# Patient Record
Sex: Female | Born: 1975 | Race: White | Hispanic: No | Marital: Married | Smoking: Never smoker
Health system: Southern US, Community
[De-identification: ages and names within clinical notes are randomized; demographics above are authoritative.]

## PROBLEM LIST (undated history)

## (undated) DIAGNOSIS — G43909 Migraine, unspecified, not intractable, without status migrainosus: Secondary | ICD-10-CM

## (undated) HISTORY — PX: GANGLION CYST EXCISION: SHX1691

## (undated) HISTORY — PX: TUBAL LIGATION: SHX77

## (undated) HISTORY — PX: OTHER SURGICAL HISTORY: SHX169

---

## 2017-01-14 ENCOUNTER — Ambulatory Visit (INDEPENDENT_AMBULATORY_CARE_PROVIDER_SITE_OTHER): Payer: BLUE CROSS/BLUE SHIELD | Admitting: Gastroenterology

## 2017-01-14 ENCOUNTER — Encounter: Payer: Self-pay | Admitting: Gastroenterology

## 2017-01-14 ENCOUNTER — Other Ambulatory Visit
Admission: RE | Admit: 2017-01-14 | Discharge: 2017-01-14 | Disposition: A | Discharge status: Home / Self Care | Payer: BLUE CROSS/BLUE SHIELD | Source: Ambulatory Visit | Attending: Gastroenterology | Admitting: Gastroenterology

## 2017-01-14 ENCOUNTER — Other Ambulatory Visit: Payer: Self-pay

## 2017-01-14 VITALS — BP 133/80 | HR 88 | Temp 99.8°F | Ht 63.0 in | Wt 253.2 lb

## 2017-01-14 DIAGNOSIS — R1011 Right upper quadrant pain: Secondary | ICD-10-CM | POA: Diagnosis present

## 2017-01-14 DIAGNOSIS — Z8719 Personal history of other diseases of the digestive system: Secondary | ICD-10-CM | POA: Insufficient documentation

## 2017-01-14 DIAGNOSIS — K625 Hemorrhage of anus and rectum: Secondary | ICD-10-CM | POA: Insufficient documentation

## 2017-01-14 DIAGNOSIS — R109 Unspecified abdominal pain: Secondary | ICD-10-CM | POA: Insufficient documentation

## 2017-01-14 LAB — BASIC METABOLIC PANEL
Anion gap: 8 (ref 5–15)
BUN: 11 mg/dL (ref 6–20)
CALCIUM: 9.1 mg/dL (ref 8.9–10.3)
CO2: 22 mmol/L (ref 22–32)
CREATININE: 0.72 mg/dL (ref 0.44–1.00)
Chloride: 108 mmol/L (ref 101–111)
GFR calc Af Amer: >60 mL/min (ref >60)
GFR calc non Af Amer: >60 mL/min (ref >60)
GLUCOSE: 105 mg/dL — AB (ref 65–99)
Potassium: 3.7 mmol/L (ref 3.5–5.1)
Sodium: 138 mmol/L (ref 135–145)

## 2017-01-14 LAB — CBC
HEMATOCRIT: 42.7 % (ref 35.0–47.0)
HEMOGLOBIN: 13.9 g/dL (ref 12.0–16.0)
MCH: 26 pg (ref 26.0–34.0)
MCHC: 32.6 g/dL (ref 32.0–36.0)
MCV: 79.6 fL — AB (ref 80.0–100.0)
Platelets: 252 10*3/uL (ref 150–440)
RBC: 5.37 MIL/uL — ABNORMAL HIGH (ref 3.80–5.20)
RDW: 15.2 % — AB (ref 11.5–14.5)
WBC: 7.5 10*3/uL (ref 3.6–11.0)

## 2017-01-14 LAB — PROTIME-INR
INR: 1.15
Prothrombin Time: 14.8 seconds (ref 11.4–15.2)

## 2017-01-14 LAB — HEPATIC FUNCTION PANEL
ALK PHOS: 63 U/L (ref 38–126)
ALT: 15 U/L (ref 14–54)
AST: 18 U/L (ref 15–41)
Albumin: 4 g/dL (ref 3.5–5.0)
BILIRUBIN TOTAL: 0.6 mg/dL (ref 0.3–1.2)
Total Protein: 7.4 g/dL (ref 6.5–8.1)

## 2017-01-14 NOTE — Progress Notes (Signed)
Stefanie Darby, MD [address]  [place]  [place], [place] [address]  Main: [phone]  Fax: [phone]    Gastroenterology Consultation  Referring Provider:     Self-referral Primary Care Physician:  NO PCP Primary Gastroenterologist:  Dr. Cephas Carpenter Reason for Consultation:     Rectal bleeding, abdominal pain        HPI:   Stefanie Carpenter is a 41 y.o. y/o female referred by Dr. Patient, No Pcp Per  for consultation & management  of rectal bleeding, abdominal pain.   Abdominal pain: She reports having what she thinks might be a "gallbladder attack". On 09/18/2016 she had right upper quadrant pain and went to the ER in [place], had an ultrasound and there were no evidence of gallstones. She had a second attack on 09/28/2016 and at that time she had HIDA scan which showed gallbladder ejection fraction of 59%. She seemed to be very worried about this pain because most of her family members had gallbladder removed and she is worried that she might have something held due to the problem with regards to her gallbladder. In addition to intermittent right upper quadrant pain she also reports abdominal pain in different locations every time. And her pain is not really associated with any other GI symptoms. She has gained weight significantly after her second child and she went on a Keto diet in the beginning of this year. Lost about 25 pounds, she felt great and cleared from depression. Later fell through the cracks, developed a lot of craving for sugars, started gaining more weight. She denies having any stress in her life.   Rectal bleeding: She reports first episode of rectal bleeding 10/06/2016 and predominantly on wiping. Second episode on 11/13/2016 associated with stool and noticed some drops of blood. Third episode on 12/10/2016 within the stool and she was constipated at the time she felt that she noticed blood for almost 12 hours and felt fatigued for a day or 2. She  reports having one to 2 bowel movements daily, denies any constipation. She reports history of menorrhagia for which she underwent uterine ablation. She is wondering if her rectal bleeding and her abdominal pain might be related to this procedure.  She is not in any prescription medications nor over-the-counter supplements. Denies smoking, drinks alcohol occasionally. She stays at home taking care of her kids.  GI Procedures: none  No past medical history on file.  No past surgical history on file.  Prior to Admission medications   Not on File    No family history on file.   Social History  Substance Use Topics  . Smoking status: Not on file  . Smokeless tobacco: Not on file  . Alcohol use Not on file    Allergies as of 01/14/2017  . (Not on File)    Review of Systems:    All systems reviewed and negative except where noted in HPI.   Physical Exam:  There were no vitals taken for this visit. No LMP recorded.  General:   Alert,  Well-developed, well-nourished, pleasant and cooperative in NAD Head:  Normocephalic and atraumatic. Eyes:  Sclera clear, no icterus.   Conjunctiva pink. Ears:  Normal auditory acuity. Nose:  No deformity, discharge, or lesions. Mouth:  No deformity or lesions,oropharynx pink & moist. Neck:  Supple; no masses or thyromegaly. Lungs:  Respirations even and unlabored.  Clear throughout to auscultation.   No wheezes, crackles, or rhonchi. No acute distress. Heart:  Regular  rate and rhythm; no murmurs, clicks, rubs, or gallops. Abdomen:  Normal bowel sounds.  No bruits.  Soft, non-tender and non-distended without masses, hepatosplenomegaly or hernias noted.  No guarding or rebound tenderness.   Rectal: Nor performed Msk:  Symmetrical without gross deformities. Good, equal movement & strength bilaterally. Pulses:  Normal pulses noted. Extremities:  No clubbing or edema.  No cyanosis. Neurologic:  Alert and oriented x3;  grossly normal  neurologically. Skin:  Intact without significant lesions or rashes. No jaundice. Lymph Nodes:  No significant cervical adenopathy. Psych:  Alert and cooperative. Normal mood and affect.  Imaging Studies: None performed  Assessment and Plan:   Stefanie Carpenter is a 41 y.o. y/o female with Morbid obesity, intermittent episodes of rectal bleeding since May 2018, generalized abdominal pain, intermittent since April 2018. Her abdominal pain appears very functional associated with her eating habits. However, I recommend Right upper quadrant ultrasound, EGD with biopsies to rule out H. Pylori and peptic ulcer disease. I also recommend colonoscopy for these episodes of rectal bleeding. Perform basic labs today. In the meantime, recommend trial of IB-guard  Follow up in 3 months   Stefanie Darby, MD

## 2017-01-14 NOTE — Patient Instructions (Signed)
1. Blood work today 2. EGD and colonoscopy 3. RUQ U/S 4.Trail of IB-gaurd  Please call our office to speak with my nurse Driscilla Grammes at [phone] during business hours from 8am to 4pm if you have any questions/concerns. During after hours, you will be redirected to on call GI physician. For any emergency please call 911 or go the nearest emergency room.    Cephas Darby, MD [address]  [place]  [place], [place] [address]  Main: [phone]  Fax: [phone]

## 2017-01-15 ENCOUNTER — Telehealth: Payer: Self-pay

## 2017-01-15 ENCOUNTER — Other Ambulatory Visit: Payer: Self-pay

## 2017-01-15 DIAGNOSIS — K625 Hemorrhage of anus and rectum: Secondary | ICD-10-CM

## 2017-01-15 DIAGNOSIS — R1011 Right upper quadrant pain: Secondary | ICD-10-CM

## 2017-01-15 NOTE — Telephone Encounter (Signed)
Pt has requested date and location change for Colonoscopy a nd EGD.  Date is now 09/04/18Tuesday at [facility].

## 2017-01-18 ENCOUNTER — Ambulatory Visit
Admission: RE | Admit: 2017-01-18 | Discharge: 2017-01-18 | Disposition: A | Discharge status: Home / Self Care | Payer: BLUE CROSS/BLUE SHIELD | Source: Ambulatory Visit | Attending: Gastroenterology | Admitting: Gastroenterology

## 2017-01-18 DIAGNOSIS — R932 Abnormal findings on diagnostic imaging of liver and biliary tract: Secondary | ICD-10-CM | POA: Diagnosis not present

## 2017-01-18 DIAGNOSIS — R1011 Right upper quadrant pain: Secondary | ICD-10-CM | POA: Diagnosis not present

## 2017-01-21 ENCOUNTER — Encounter: Payer: Self-pay | Admitting: *Deleted

## 2017-01-27 NOTE — Discharge Instructions (Signed)
General Anesthesia, Adult, Care After °These instructions provide you with information about caring for yourself after your procedure. Your health care provider may also give you more specific instructions. Your treatment has been planned according to current medical practices, but problems sometimes occur. Call your health care provider if you have any problems or questions after your procedure. °What can I expect after the procedure? °After the procedure, it is common to have: °· Vomiting. °· A sore throat. °· Mental slowness. ° °It is common to feel: °· Nauseous. °· Cold or shivery. °· Sleepy. °· Tired. °· Sore or achy, even in parts of your body where you did not have surgery. ° °Follow these instructions at home: °For at least 24 hours after the procedure: °· Do not: °? Participate in activities where you could fall or become injured. °? Drive. °? Use heavy machinery. °? Drink alcohol. °? Take sleeping pills or medicines that cause drowsiness. °? Make important decisions or sign legal documents. °? Take care of children on your own. °· Rest. °Eating and drinking °· If you vomit, drink water, juice, or soup when you can drink without vomiting. °· Drink enough fluid to keep your urine clear or pale yellow. °· Make sure you have little or no nausea before eating solid foods. °· Follow the diet recommended by your health care provider. °General instructions °· Have a responsible adult stay with you until you are awake and alert. °· Return to your normal activities as told by your health care provider. Ask your health care provider what activities are safe for you. °· Take over-the-counter and prescription medicines only as told by your health care provider. °· If you smoke, do not smoke without supervision. °· Keep all follow-up visits as told by your health care provider. This is important. °Contact a health care provider if: °· You continue to have nausea or vomiting at home, and medicines are not helpful. °· You  cannot drink fluids or start eating again. °· You cannot urinate after 8-12 hours. °· You develop a skin rash. °· You have fever. °· You have increasing redness at the site of your procedure. °Get help right away if: °· You have difficulty breathing. °· You have chest pain. °· You have unexpected bleeding. °· You feel that you are having a life-threatening or urgent problem. °This information is not intended to replace advice given to you by your health care provider. Make sure you discuss any questions you have with your health care provider. °Document Released: 08/17/2000 Document Revised: 10/14/2015 Document Reviewed: 04/25/2015 °Elsevier Interactive Patient Education © 2018 [employer] Inc. ° °

## 2017-01-28 ENCOUNTER — Ambulatory Visit: Payer: BLUE CROSS/BLUE SHIELD | Admitting: Anesthesiology

## 2017-01-28 ENCOUNTER — Encounter
Admission: RE | Disposition: A | Discharge status: Home / Self Care | Payer: Self-pay | Source: Ambulatory Visit | Attending: Gastroenterology

## 2017-01-28 ENCOUNTER — Ambulatory Visit
Admission: RE | Admit: 2017-01-28 | Discharge: 2017-01-28 | Disposition: A | Discharge status: Home / Self Care | Payer: BLUE CROSS/BLUE SHIELD | Source: Ambulatory Visit | Attending: Gastroenterology | Admitting: Gastroenterology

## 2017-01-28 DIAGNOSIS — R1011 Right upper quadrant pain: Secondary | ICD-10-CM | POA: Diagnosis not present

## 2017-01-28 DIAGNOSIS — K635 Polyp of colon: Secondary | ICD-10-CM

## 2017-01-28 DIAGNOSIS — K625 Hemorrhage of anus and rectum: Secondary | ICD-10-CM

## 2017-01-28 DIAGNOSIS — D125 Benign neoplasm of sigmoid colon: Secondary | ICD-10-CM | POA: Diagnosis not present

## 2017-01-28 DIAGNOSIS — K640 First degree hemorrhoids: Secondary | ICD-10-CM | POA: Diagnosis not present

## 2017-01-28 DIAGNOSIS — D123 Benign neoplasm of transverse colon: Secondary | ICD-10-CM | POA: Diagnosis not present

## 2017-01-28 DIAGNOSIS — K921 Melena: Secondary | ICD-10-CM | POA: Insufficient documentation

## 2017-01-28 DIAGNOSIS — R1013 Epigastric pain: Secondary | ICD-10-CM | POA: Diagnosis not present

## 2017-01-28 HISTORY — PX: COLONOSCOPY WITH PROPOFOL: SHX5780

## 2017-01-28 HISTORY — PX: POLYPECTOMY: SHX5525

## 2017-01-28 HISTORY — PX: ESOPHAGOGASTRODUODENOSCOPY (EGD) WITH PROPOFOL: SHX5813

## 2017-01-28 HISTORY — DX: Migraine, unspecified, not intractable, without status migrainosus: G43.909

## 2017-01-28 SURGERY — COLONOSCOPY WITH PROPOFOL
Anesthesia: General | Site: Rectum | Wound class: Dirty or Infected

## 2017-01-28 MED ORDER — LACTATED RINGERS IV SOLN
10.0000 mL/h | INTRAVENOUS | Status: DC
  Administered 2017-01-28: 10:00:00 via INTRAVENOUS
  Administered 2017-01-28: 10 mL/h via INTRAVENOUS

## 2017-01-28 MED ORDER — STERILE WATER FOR IRRIGATION IR SOLN
Status: DC | PRN
  Administered 2017-01-28: 10:00:00

## 2017-01-28 MED ORDER — SODIUM CHLORIDE 0.9 % IV SOLN: INTRAVENOUS | Status: DC

## 2017-01-28 MED ORDER — PROPOFOL 10 MG/ML IV BOLUS
INTRAVENOUS | Status: DC | PRN
  Administered 2017-01-28 (×3): 20 mg via INTRAVENOUS
  Administered 2017-01-28: 80 mg via INTRAVENOUS
  Administered 2017-01-28: 20 mg via INTRAVENOUS
  Administered 2017-01-28: 30 mg via INTRAVENOUS
  Administered 2017-01-28: 20 mg via INTRAVENOUS
  Administered 2017-01-28: 10 mg via INTRAVENOUS

## 2017-01-28 MED ORDER — GLYCOPYRROLATE 0.2 MG/ML IJ SOLN
INTRAMUSCULAR | Status: DC | PRN
  Administered 2017-01-28: 0.2 mg via INTRAVENOUS

## 2017-01-28 MED ORDER — PROMETHAZINE HCL 25 MG/ML IJ SOLN: 6.2500 mg | INTRAMUSCULAR | Status: DC | PRN

## 2017-01-28 MED ORDER — MEPERIDINE HCL 25 MG/ML IJ SOLN: 6.2500 mg | INTRAMUSCULAR | Status: DC | PRN

## 2017-01-28 MED ORDER — LIDOCAINE HCL (CARDIAC) 20 MG/ML IV SOLN
INTRAVENOUS | Status: DC | PRN
  Administered 2017-01-28: 50 mg via INTRAVENOUS

## 2017-01-28 MED ORDER — FENTANYL CITRATE (PF) 100 MCG/2ML IJ SOLN: 25.0000 ug | INTRAMUSCULAR | Status: DC | PRN

## 2017-01-28 MED ORDER — OXYCODONE HCL 5 MG PO TABS
5.0000 mg | ORAL_TABLET | Freq: Once | ORAL | Status: DC | PRN
Start: 2017-01-28 — End: 2017-01-28

## 2017-01-28 MED ORDER — OXYCODONE HCL 5 MG/5ML PO SOLN: 5.0000 mg | Freq: Once | ORAL | Status: DC | PRN

## 2017-01-28 SURGICAL SUPPLY — 35 items
BALLN DILATOR 10-12 8 (BALLOONS)
BALLN DILATOR 12-15 8 (BALLOONS)
BALLN DILATOR 15-18 8 (BALLOONS)
BALLN DILATOR CRE 0-12 8 (BALLOONS)
BALLN DILATOR ESOPH 8 10 CRE (MISCELLANEOUS) IMPLANT
BALLOON DILATOR 12-15 8 (BALLOONS) IMPLANT
BALLOON DILATOR 15-18 8 (BALLOONS) IMPLANT
BALLOON DILATOR CRE 0-12 8 (BALLOONS) IMPLANT
BLOCK BITE 60FR ADLT L/F GRN (MISCELLANEOUS) ×4 IMPLANT
CANISTER SUCT 1200ML W/VALVE (MISCELLANEOUS) ×4 IMPLANT
CLIP HMST 235XBRD CATH ROT (MISCELLANEOUS) IMPLANT
CLIP RESOLUTION 360 11X235 (MISCELLANEOUS)
FCP ESCP3.2XJMB 240X2.8X (MISCELLANEOUS)
FORCEPS BIOP RAD 4 LRG CAP 4 (CUTTING FORCEPS) ×4 IMPLANT
FORCEPS BIOP RJ4 240 W/NDL (MISCELLANEOUS)
FORCEPS ESCP3.2XJMB 240X2.8X (MISCELLANEOUS) IMPLANT
GOWN CVR UNV OPN BCK APRN NK (MISCELLANEOUS) ×4 IMPLANT
GOWN ISOL THUMB LOOP REG UNIV (MISCELLANEOUS) ×4
INJECTOR VARIJECT VIN23 (MISCELLANEOUS) IMPLANT
KIT DEFENDO VALVE AND CONN (KITS) IMPLANT
KIT ENDO PROCEDURE OLY (KITS) ×4 IMPLANT
MARKER SPOT ENDO TATTOO 5ML (MISCELLANEOUS) IMPLANT
PAD GROUND ADULT SPLIT (MISCELLANEOUS) IMPLANT
PROBE APC STR FIRE (PROBE) IMPLANT
RETRIEVER NET PLAT FOOD (MISCELLANEOUS) IMPLANT
RETRIEVER NET ROTH 2.5X230 LF (MISCELLANEOUS) IMPLANT
SNARE SHORT THROW 13M SML OVAL (MISCELLANEOUS) IMPLANT
SNARE SHORT THROW 30M LRG OVAL (MISCELLANEOUS) IMPLANT
SNARE SNG USE RND 15MM (INSTRUMENTS) IMPLANT
SPOT EX ENDOSCOPIC TATTOO (MISCELLANEOUS)
SYR INFLATION 60ML (SYRINGE) IMPLANT
TRAP ETRAP POLY (MISCELLANEOUS) IMPLANT
VARIJECT INJECTOR VIN23 (MISCELLANEOUS)
WATER STERILE IRR 250ML POUR (IV SOLUTION) ×4 IMPLANT
WIRE CRE 18-20MM 8CM F G (MISCELLANEOUS) IMPLANT

## 2017-01-28 NOTE — H&P (Signed)
   Lucilla Lame, MD [address] [place], [place] [address] [phone] Fax : [phone]  Primary Care Physician:  Patient, No Pcp Per Primary Gastroenterologist:  Dr. Allen Norris  Pre-Procedure History & Physical: HPI:  Stefanie Carpenter is a 41 y.o. female is here for an endoscopy and colonoscopy.   Past Medical History:  Diagnosis Date  . Migraine headache    1-2x/mo    Past Surgical History:  Procedure Laterality Date  . GANGLION CYST EXCISION    . nova sure    . TUBAL LIGATION      Prior to Admission medications   Not on File    Allergies as of 01/15/2017  . (No Known Allergies)    History reviewed. No pertinent family history.  Social History   Social History  . Marital status: Married    Spouse name: N/A  . Number of children: N/A  . Years of education: N/A   Occupational History  . Not on file.   Social History Main Topics  . Smoking status: Never Smoker  . Smokeless tobacco: Never Used  . Alcohol use Yes     Comment: occasional wine  . Drug use: No  . Sexual activity: Yes   Other Topics Concern  . Not on file   Social History Narrative  . No narrative on file    Review of Systems: See HPI, otherwise negative ROS  Physical Exam: BP 107/79   Pulse 75   Temp 97.9 F (36.6 C)   Resp 16   Ht 5\' 3"  (1.6 m)   Wt 246 lb (111.6 kg)   LMP 10/06/2016 (Approximate) Comment: Nova sure 03/15. still spots occasionally preg test neg  SpO2 100%   BMI 43.58 kg/m  General:   Alert,  pleasant and cooperative in NAD Head:  Normocephalic and atraumatic. Neck:  Supple; no masses or thyromegaly. Lungs:  Clear throughout to auscultation.    Heart:  Regular rate and rhythm. Abdomen:  Soft, nontender and nondistended. Normal bowel sounds, without guarding, and without rebound.   Neurologic:  Alert and  oriented x4;  grossly normal neurologically.  Impression/Plan: Stefanie Carpenter is here for an endoscopy and colonoscopy to be performed  for hematochezia and abd pain  Risks, benefits, limitations, and alternatives regarding  endoscopy and colonoscopy have been reviewed with the patient.  Questions have been answered.  All parties agreeable.   Lucilla Lame, MD  01/28/2017, 9:34 AM

## 2017-01-28 NOTE — Anesthesia Postprocedure Evaluation (Signed)
Anesthesia Post Note  Patient: Stefanie Carpenter  Procedure(s) Performed: Procedure(s) (LRB): COLONOSCOPY WITH PROPOFOL (N/A) ESOPHAGOGASTRODUODENOSCOPY (EGD) WITH PROPOFOL (N/A) POLYPECTOMY (N/A)  Patient location during evaluation: PACU Anesthesia Type: General Level of consciousness: awake and alert Pain management: pain level controlled Vital Signs Assessment: post-procedure vital signs reviewed and stable Respiratory status: spontaneous breathing, nonlabored ventilation, respiratory function stable and patient connected to nasal cannula oxygen Cardiovascular status: blood pressure returned to baseline and stable Postop Assessment: no signs of nausea or vomiting Anesthetic complications: no    SCOURAS, NICOLE ELAINE

## 2017-01-28 NOTE — Transfer of Care (Signed)
Immediate Anesthesia Transfer of Care Note  Patient: Stefanie Carpenter  Procedure(s) Performed: Procedure(s) with comments: COLONOSCOPY WITH PROPOFOL (N/A) ESOPHAGOGASTRODUODENOSCOPY (EGD) WITH PROPOFOL (N/A) - requests later in AM POLYPECTOMY (N/A)  Patient Location: PACU  Anesthesia Type: General  Level of Consciousness: awake, alert  and patient cooperative  Airway and Oxygen Therapy: Patient Spontanous Breathing and Patient connected to supplemental oxygen  Post-op Assessment: Post-op Vital signs reviewed, Patient's Cardiovascular Status Stable, Respiratory Function Stable, Patent Airway and No signs of Nausea or vomiting  Post-op Vital Signs: Reviewed and stable  Complications: No apparent anesthesia complications

## 2017-01-28 NOTE — Anesthesia Procedure Notes (Signed)
Performed by: Elli Groesbeck Pre-anesthesia Checklist: Patient identified, Emergency Drugs available, Suction available, Timeout performed and Patient being monitored Patient Re-evaluated:Patient Re-evaluated prior to induction Oxygen Delivery Method: Nasal cannula Placement Confirmation: positive ETCO2       

## 2017-01-28 NOTE — Anesthesia Preprocedure Evaluation (Signed)
Anesthesia Evaluation  Patient identified by MRN, date of birth, ID band Patient awake    Reviewed: Allergy & Precautions, H&P , NPO status , Patient's Chart, lab work & pertinent test results, reviewed documented beta blocker date and time   Airway Mallampati: II  TM Distance: >3 FB Neck ROM: full    Dental no notable dental hx.    Pulmonary neg pulmonary ROS,    Pulmonary exam normal breath sounds clear to auscultation       Cardiovascular Exercise Tolerance: Good negative cardio ROS   Rhythm:regular Rate:Normal     Neuro/Psych  Headaches, negative psych ROS   GI/Hepatic negative GI ROS, Neg liver ROS,   Endo/Other  negative endocrine ROS  Renal/GU negative Renal ROS  negative genitourinary   Musculoskeletal   Abdominal   Peds  Hematology negative hematology ROS (+)   Anesthesia Other Findings   Reproductive/Obstetrics negative OB ROS                             Anesthesia Physical Anesthesia Plan  ASA: II  Anesthesia Plan: General   Post-op Pain Management:    Induction:   PONV Risk Score and Plan:   Airway Management Planned:   Additional Equipment:   Intra-op Plan:   Post-operative Plan:   Informed Consent: I have reviewed the patients History and Physical, chart, labs and discussed the procedure including the risks, benefits and alternatives for the proposed anesthesia with the patient or authorized representative who has indicated his/her understanding and acceptance.   Dental Advisory Given  Plan Discussed with: CRNA  Anesthesia Plan Comments:         Anesthesia Quick Evaluation

## 2017-01-28 NOTE — Op Note (Signed)
[facility] Gastroenterology Patient Name: Stefanie Carpenter Procedure Date: 01/28/2017 10:04 AM MRN: [id number] Account #: [id number] Date of Birth: July 22, 1975 Admit Type: Outpatient Age: 41 Room: [facility] OR ROOM 01 Gender: Female Note Status: Finalized Procedure:            Upper GI endoscopy Indications:          Epigastric abdominal pain, Abdominal pain in the right                        upper quadrant Providers:            Lucilla Lame MD, MD Medicines:            Propofol per Anesthesia Complications:        No immediate complications. Procedure:            Pre-Anesthesia Assessment:                       - Prior to the procedure, a History and Physical was                        performed, and patient medications and allergies were                        reviewed. The patient's tolerance of previous                        anesthesia was also reviewed. The risks and benefits of                        the procedure and the sedation options and risks were                        discussed with the patient. All questions were                        answered, and informed consent was obtained. Prior                        Anticoagulants: The patient has taken no previous                        anticoagulant or antiplatelet agents. ASA Grade                        Assessment: II - A patient with mild systemic disease.                        After reviewing the risks and benefits, the patient was                        deemed in satisfactory condition to undergo the                        procedure.                       After obtaining informed consent, the endoscope was                        passed under direct vision.  Throughout the procedure,                        the patient's blood pressure, pulse, and oxygen                        saturations were monitored continuously. The Olympus                        [id number]-[id number] Endoscope (S#. [id number]) was introduced                 through the mouth, and advanced to the second part of                        duodenum. The upper GI endoscopy was accomplished                        without difficulty. The patient tolerated the procedure                        well. Findings:      The examined esophagus was normal.      The stomach was normal.      The examined duodenum was normal. Impression:           - Normal esophagus.                       - Normal stomach.                       - Normal examined duodenum.                       - No specimens collected. Recommendation:       - Discharge patient to home.                       - Resume previous diet.                       - Continue present medications.                       - Perform a colonoscopy today. Procedure Code(s):    --- Professional ---                       [phone], Esophagogastroduodenoscopy, flexible, transoral;                        diagnostic, including collection of specimen(s) by                        brushing or washing, when performed (separate procedure) Diagnosis Code(s):    --- Professional ---                       R10.13, Epigastric pain                       R10.11, Right upper quadrant pain CPT copyright 2016 American Medical Association. All rights reserved. The codes documented in this report are preliminary and upon coder review may  be revised to meet current compliance requirements. Lucilla Lame MD, MD  01/28/2017 10:19:23 AM This report has been signed electronically. Number of Addenda: 0 Note Initiated On: 01/28/2017 10:04 AM      [facility]

## 2017-01-28 NOTE — Op Note (Signed)
[facility] Gastroenterology Patient Name: Stefanie Carpenter Procedure Date: 01/28/2017 10:04 AM MRN: [id number] Account #: [id number] Date of Birth: 28-Mar-1976 Admit Type: Outpatient Age: 41 Room: [facility] OR ROOM 01 Gender: Female Note Status: Finalized Procedure:            Colonoscopy Indications:          Hematochezia Providers:            Lucilla Lame MD, MD Medicines:            Propofol per Anesthesia Complications:        No immediate complications. Procedure:            Pre-Anesthesia Assessment:                       - Prior to the procedure, a History and Physical was                        performed, and patient medications and allergies were                        reviewed. The patient's tolerance of previous                        anesthesia was also reviewed. The risks and benefits of                        the procedure and the sedation options and risks were                        discussed with the patient. All questions were                        answered, and informed consent was obtained. Prior                        Anticoagulants: The patient has taken no previous                        anticoagulant or antiplatelet agents. ASA Grade                        Assessment: II - A patient with mild systemic disease.                        After reviewing the risks and benefits, the patient was                        deemed in satisfactory condition to undergo the                        procedure.                       After obtaining informed consent, the colonoscope was                        passed under direct vision. Throughout the procedure,                        the patient's blood pressure, pulse, and oxygen  saturations were monitored continuously. The Olympus                        Colonoscope [id number] was introduced through the                        anus and advanced to the the cecum, identified by           appendiceal orifice and ileocecal valve. The                        colonoscopy was performed without difficulty. The                        patient tolerated the procedure well. The quality of                        the bowel preparation was excellent. Findings:      The perianal and digital rectal examinations were normal.      A 2 mm polyp was found in the transverse colon. The polyp was sessile.       The polyp was removed with a cold biopsy forceps. Resection and       retrieval were complete.      A 2 mm polyp was found in the sigmoid colon. The polyp was sessile. The       polyp was removed with a cold biopsy forceps. Resection and retrieval       were complete.      Non-bleeding internal hemorrhoids were found during retroflexion. The       hemorrhoids were Grade I (internal hemorrhoids that do not prolapse). Impression:           - One 2 mm polyp in the transverse colon, removed with                        a cold biopsy forceps. Resected and retrieved.                       - One 2 mm polyp in the sigmoid colon, removed with a                        cold biopsy forceps. Resected and retrieved.                       - Non-bleeding internal hemorrhoids. Recommendation:       - Discharge patient to home.                       - Resume previous diet.                       - Continue present medications.                       - Await pathology results.                       - Repeat colonoscopy in 5 years if polyp adenoma and 10                        years if hyperplastic Procedure Code(s):    ---  Professional ---                       [phone], Colonoscopy, flexible; with biopsy, single or                        multiple Diagnosis Code(s):    --- Professional ---                       K92.1, Melena (includes Hematochezia)                       D12.3, Benign neoplasm of transverse colon (hepatic                        flexure or splenic flexure)                       D12.5,  Benign neoplasm of sigmoid colon CPT copyright 2016 American Medical Association. All rights reserved. The codes documented in this report are preliminary and upon coder review may  be revised to meet current compliance requirements. Lucilla Lame MD, MD 01/28/2017 10:29:10 AM This report has been signed electronically. Number of Addenda: 0 Note Initiated On: 01/28/2017 10:04 AM Scope Withdrawal Time: 0 hours 6 minutes 47 seconds  Total Procedure Duration: 0 hours 8 minutes 25 seconds       [facility]

## 2017-01-29 ENCOUNTER — Encounter: Payer: Self-pay | Admitting: Gastroenterology

## 2017-02-01 ENCOUNTER — Encounter: Payer: Self-pay | Admitting: Gastroenterology

## 2017-02-11 ENCOUNTER — Ambulatory Visit: Admit: 2017-02-11 | Payer: BLUE CROSS/BLUE SHIELD

## 2017-02-11 SURGERY — COLONOSCOPY WITH PROPOFOL
Anesthesia: General

## 2017-03-01 ENCOUNTER — Encounter: Payer: Self-pay | Admitting: Gastroenterology

## 2017-07-30 DIAGNOSIS — M2550 Pain in unspecified joint: Secondary | ICD-10-CM | POA: Insufficient documentation

## 2017-07-30 DIAGNOSIS — R768 Other specified abnormal immunological findings in serum: Secondary | ICD-10-CM | POA: Insufficient documentation

## 2017-08-03 ENCOUNTER — Encounter: Payer: Self-pay | Admitting: Gastroenterology

## 2017-08-03 ENCOUNTER — Ambulatory Visit: Payer: BLUE CROSS/BLUE SHIELD | Admitting: Gastroenterology

## 2017-08-03 VITALS — BP 137/99 | HR 86 | Temp 98.4°F | Ht 63.0 in | Wt 262.0 lb

## 2017-08-03 DIAGNOSIS — K625 Hemorrhage of anus and rectum: Secondary | ICD-10-CM | POA: Diagnosis not present

## 2017-08-03 DIAGNOSIS — K760 Fatty (change of) liver, not elsewhere classified: Secondary | ICD-10-CM

## 2017-08-03 DIAGNOSIS — R1013 Epigastric pain: Secondary | ICD-10-CM | POA: Diagnosis not present

## 2017-08-03 DIAGNOSIS — K640 First degree hemorrhoids: Secondary | ICD-10-CM

## 2017-08-03 NOTE — Progress Notes (Signed)
Stefanie Darby, MD [address]  [place]  [place], [place] [address]  Main: [phone]  Fax: [phone]    Gastroenterology Consultation  Referring Provider:     Self-referral Primary Care Physician:  NO PCP Primary Gastroenterologist:  Dr. Cephas Carpenter Reason for Consultation:     Rectal bleeding, abdominal pain        HPI:   Stefanie Carpenter is a 42 y.o. y/o female self referred for consultation & management  of rectal bleeding, abdominal pain.   Abdominal pain: She reports having what she thinks might be a "gallbladder attack". On 09/18/2016 she had right upper quadrant pain and went to the ER in [place], had an ultrasound and there were no evidence of gallstones. She had a second attack on 09/28/2016 and at that time she had HIDA scan which showed gallbladder ejection fraction of 59%. She seemed to be very worried about this pain because most of her family members had gallbladder removed and she is worried that she might have something held due to the problem with regards to her gallbladder. In addition to intermittent right upper quadrant pain she also reports abdominal pain in different locations every time. And her pain is not really associated with any other GI symptoms. She has gained weight significantly after her second child and she went on a Keto diet in the beginning of this year. Lost about 25 pounds, she felt great and cleared from depression. Later fell through the cracks, developed a lot of craving for sugars, started gaining more weight. She denies having any stress in her life.   Rectal bleeding: She reports first episode of rectal bleeding 10/06/2016 and predominantly on wiping. Second episode on 11/13/2016 associated with stool and noticed some drops of blood. Third episode on 12/10/2016 within the stool and she was constipated at the time she felt that she noticed blood for almost 12 hours and felt fatigued for a day or 2. She reports having one to 2 bowel  movements daily, denies any constipation. She reports history of menorrhagia for which she underwent uterine ablation. She is wondering if her rectal bleeding and her abdominal pain might be related to this procedure.  She is not in any prescription medications nor over-the-counter supplements. Denies smoking, drinks alcohol occasionally. She stays at home taking care of her kids.  Follow-up visit 08/03/2017 Have not seen her since 12/2016. Patient underwent EGD and colonoscopy. EGD was unremarkable. 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 Patient is here for follow-up visit today. She reports that she had a lot of things going on in her life, personal and medical. 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 She has dry eyes and takes artificial tears. She appears to be worried about fatty liver and ongoing intermittent, sporadic episodes of sharp right upper quadrant pain. She had a right upper quadrant ultrasound which revealed fatty liver only, there was no evidence of cholelithiasis and CBD normal size. She reports that she has these attacks about once or twice a week. She says she is watching her diet, cut back on red meat, artificial sweeteners but consumes other high-calorie foods including white bread, cheese, pasta. Her BMI is 46. She had only 1 episode of mild rectal bleeding in 06/2017 which was self-limited. She reports that her bowel movements are fairly regular.  GI Procedures:  EGD 01/28/17 - Normal esophagus. - Normal stomach. - Normal examined duodenum. - No specimens collected.  Colonoscopy 01/28/17 - One 2 mm polyp in the transverse colon, removed with a cold biopsy forceps. Resected and retrieved. - One 2 mm polyp in the sigmoid colon, removed with a cold biopsy forceps. Resected and retrieved. - Non-bleeding internal hemorrhoids.   Past Medical History:  Diagnosis Date  . Migraine headache     1-2x/mo    Past Surgical History:  Procedure Laterality Date  . COLONOSCOPY WITH PROPOFOL N/A 01/28/2017   Procedure: COLONOSCOPY WITH PROPOFOL;  Surgeon: Lucilla Lame, MD;  Location: [facility];  Service: Gastroenterology;  Laterality: N/A;  . ESOPHAGOGASTRODUODENOSCOPY (EGD) WITH PROPOFOL N/A 01/28/2017   Procedure: ESOPHAGOGASTRODUODENOSCOPY (EGD) WITH PROPOFOL;  Surgeon: Lucilla Lame, MD;  Location: [facility];  Service: Gastroenterology;  Laterality: N/A;  requests later in AM  . GANGLION CYST EXCISION    . nova sure    . POLYPECTOMY N/A 01/28/2017   Procedure: POLYPECTOMY;  Surgeon: Lucilla Lame, MD;  Location: [facility];  Service: Gastroenterology;  Laterality: N/A;  . TUBAL LIGATION       Current Outpatient Medications:  .  Propylene Glycol (SYSTANE BALANCE) 0.6 % SOLN, Apply to eye., Disp: , Rfl:   No family history on file.   Social History   Tobacco Use  . Smoking status: Never Smoker  . Smokeless tobacco: Never Used  Substance Use Topics  . Alcohol use: Yes    Comment: occasional wine  . Drug use: No    Allergies as of 08/03/2017 - Review Complete 08/03/2017  Allergen Reaction Noted  . Acetaminophen Hives 07/30/2017  . Codeine Itching 07/30/2017    Review of Systems:    All systems reviewed and negative except where noted in HPI.   Physical Exam:  BP (!) 137/99   Pulse 86   Temp 98.4 F (36.9 C) (Oral)   Ht 5\' 3"  (1.6 m)   Wt 262 lb (118.8 kg)   BMI 46.41 kg/m  No LMP recorded. Patient has had an ablation.  General:   Alert,  Well-developed, well-nourished, pleasant and cooperative in NAD Head:  Normocephalic and atraumatic. Eyes:  Sclera clear, no icterus.   Conjunctiva pink. Ears:  Normal auditory acuity. Nose:  No deformity, discharge, or lesions. Mouth:  No deformity or lesions,oropharynx pink & moist. Neck:  Supple; no masses or thyromegaly. Lungs:  Respirations even and unlabored.  Clear throughout to auscultation.    No wheezes, crackles, or rhonchi. No acute distress. Heart:  Regular rate and rhythm; no murmurs, clicks, rubs, or gallops. Abdomen:  Normal bowel sounds. Limited abdominal exam secondary to significant abdominal obesity. No bruits.  Soft, mild epigastric and RUQ tenderness and non-distended  No guarding or rebound tenderness.   Rectal: Nor performed Msk:  Symmetrical without gross deformities. Good, equal movement & strength bilaterally. Pulses:  Normal pulses noted. Extremities:  No clubbing or edema.  No cyanosis. Neurologic:  Alert and oriented x3;  grossly normal neurologically. Skin:  Intact without significant lesions or rashes. No jaundice. Psych:  Alert and cooperative. Normal mood and affect.  Imaging Studies: None performed  Assessment and Plan:   Donnajean Chesnut is a 42 y.o. Caucasian female with Morbid obesity, BMI 46, intermittent episodes of rectal bleeding since May 2018, intermittent right upper quadrant pain since April 2018. She is here for follow-up today.  Rectal bleeding: intermittent, self-limiting. He had a colonoscopy which revealed internal hemorrhoids There was no evidence of colonic inflammation. Recommended her to avoid high carbs., Stay on high fiber diet Discussed with her  about outpatient hemorrhoid ligation if she develops frequent bleeding from hemorrhoids  Right upper quadrant/ epigastric pain:right upper quadrant ultrasound revealed fatty liver, no evidence of cholelithiasis or biliary duct obstruction Encouraged her to lose weight Given her information about [facility] bariat[facility] illness program Trial of FD-guard  Fatty liver: normal LFTs Again, discussed with her about weight loss through diet and exercise to reverse this condition Discussed with her about implications of progression of fatty liver to [place] and cirrhosis   Follow up as needed   Stefanie Darby, MD

## 2017-08-03 NOTE — Patient Instructions (Addendum)
High-Fiber Diet  Fiber, also called dietary fiber, is a type of carbohydrate found in fruits, vegetables, whole grains, and beans. A high-fiber diet can have many health benefits. Your health care provider may recommend a high-fiber diet to help:  · Prevent constipation. Fiber can make your bowel movements more regular.  · Lower your cholesterol.  · Relieve hemorrhoids, uncomplicated diverticulosis, or irritable bowel syndrome.  · Prevent overeating as part of a weight-loss plan.  · Prevent heart disease, type 2 diabetes, and certain cancers.    What is my plan?  The recommended daily intake of fiber includes:  · 38 grams for men under age 50.  · 30 grams for men over age 50.  · 25 grams for women under age 50.  · 21 grams for women over age 50.    You can get the recommended daily intake of dietary fiber by eating a variety of fruits, vegetables, grains, and beans. Your health care provider may also recommend a fiber supplement if it is not possible to get enough fiber through your diet.  What do I need to know about a high-fiber diet?  · Fiber supplements have not been widely studied for their effectiveness, so it is better to get fiber through food sources.  · Always check the fiber content on the nutrition facts label of any prepackaged food. Look for foods that contain at least 5 grams of fiber per serving.  · Ask your dietitian if you have questions about specific foods that are related to your condition, especially if those foods are not listed in the following section.  · Increase your daily fiber consumption gradually. Increasing your intake of dietary fiber too quickly may cause bloating, cramping, or gas.  · Drink plenty of water. Water helps you to digest fiber.  What foods can I eat?  Grains  Whole-grain breads. Multigrain cereal. Oats and oatmeal. Brown rice. Barley. Bulgur wheat. Millet. Bran muffins. Popcorn. Rye wafer crackers.  Vegetables   Sweet potatoes. Spinach. Kale. Artichokes. Cabbage. Broccoli. Green peas. Carrots. Squash.  Fruits  Berries. Pears. Apples. Oranges. Avocados. Prunes and raisins. Dried figs.  Meats and Other Protein Sources  Navy, kidney, pinto, and soy beans. Split peas. Lentils. Nuts and seeds.  Dairy  Fiber-fortified yogurt.  Beverages  Fiber-fortified soy milk. Fiber-fortified orange juice.  Other  Fiber bars.  The items listed above may not be a complete list of recommended foods or beverages. Contact your dietitian for more options.  What foods are not recommended?  Grains  White bread. Pasta made with refined flour. White rice.  Vegetables  Fried potatoes. Canned vegetables. Well-cooked vegetables.  Fruits  Fruit juice. Cooked, strained fruit.  Meats and Other Protein Sources  Fatty cuts of meat. Fried poultry or fried fish.  Dairy  Milk. Yogurt. Cream cheese. Sour cream.  Beverages  Soft drinks.  Other  Cakes and pastries. Butter and oils.  The items listed above may not be a complete list of foods and beverages to avoid. Contact your dietitian for more information.  What are some tips for including high-fiber foods in my diet?  · Eat a wide variety of high-fiber foods.  · Make sure that half of all grains consumed each day are whole grains.  · Replace breads and cereals made from refined flour or white flour with whole-grain breads and cereals.  · Replace white rice with brown rice, bulgur wheat, or millet.  · Start the day with a breakfast that is high in fiber,   such as a cereal that contains at least 5 grams of fiber per serving.  · Use beans in place of meat in soups, salads, or pasta.  · Eat high-fiber snacks, such as berries, raw vegetables, nuts, or popcorn.  This information is not intended to replace advice given to you by your health care provider. Make sure you discuss any questions you have with your health care provider.  Document Released: 05/11/2005 Document Revised: 10/17/2015 Document Reviewed: 10/24/2013   Elsevier Interactive Patient Education © 2018 [employer]

## 2019-01-07 IMAGING — US US ABDOMEN LIMITED
1 series · 14 of 25 positions shown · non-contrast
Comparison: No recent prior.

CLINICAL DATA: Right upper quadrant pain.

EXAM:
ULTRASOUND ABDOMEN LIMITED RIGHT UPPER QUADRANT

[Series 1: us abdomen limited · 0.23mm/px · 14 of 71 slices shown]
[im 1/71]
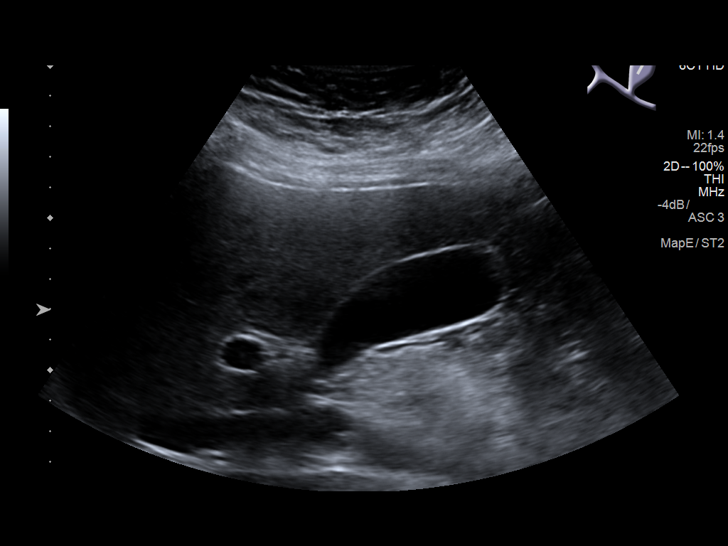
[im 6/71]
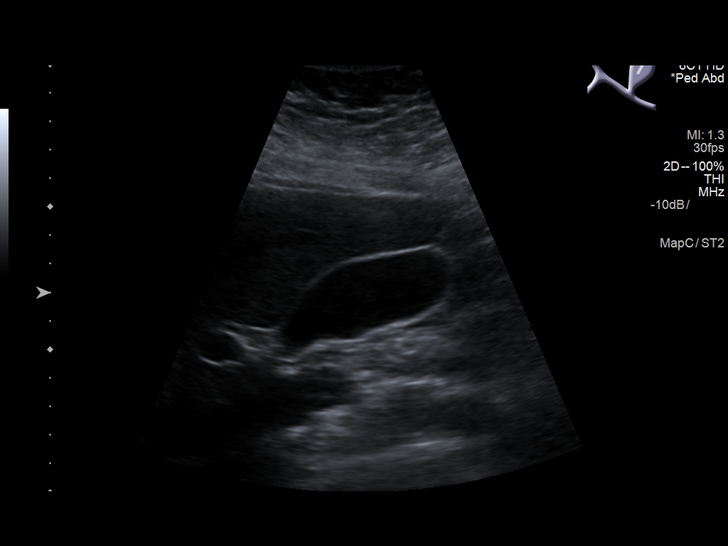
[im 12/71]
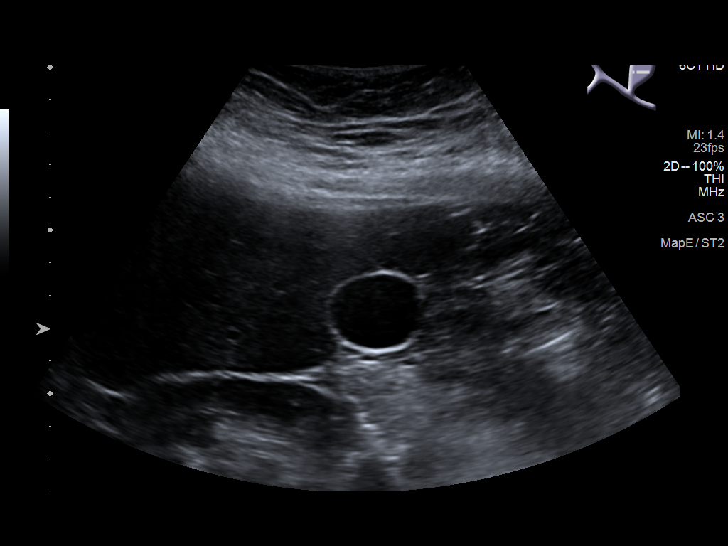
[im 18/71]
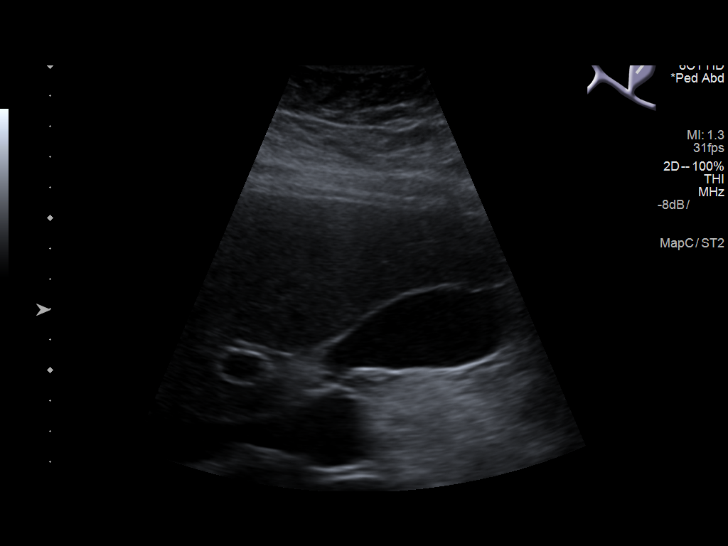
[im 24/71]
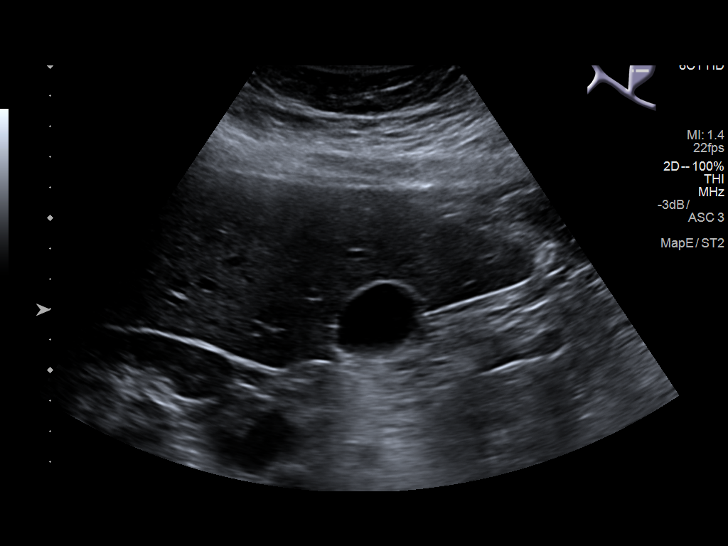
[im 27/71]
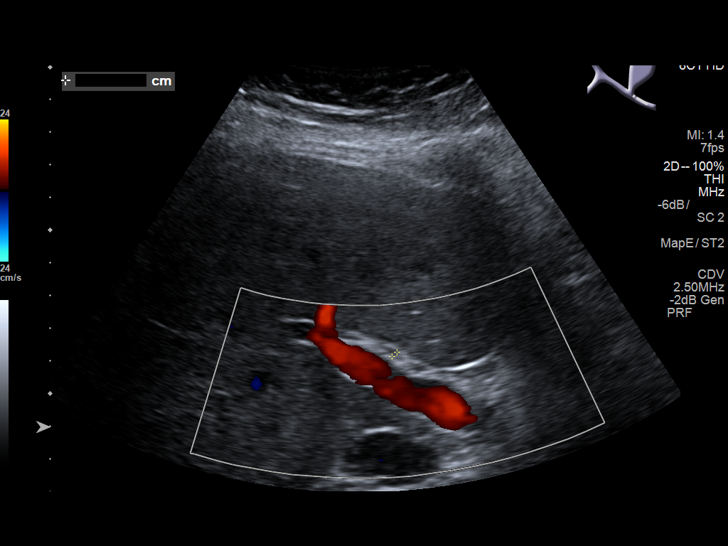
[im 33/71]
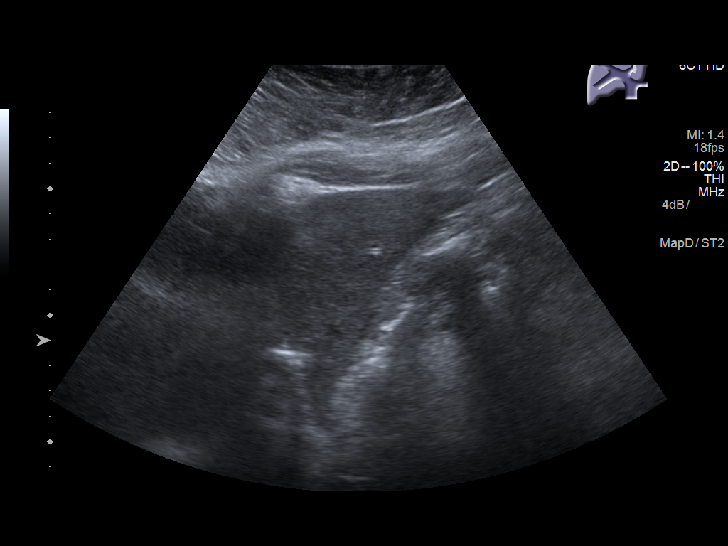
[im 38/71]
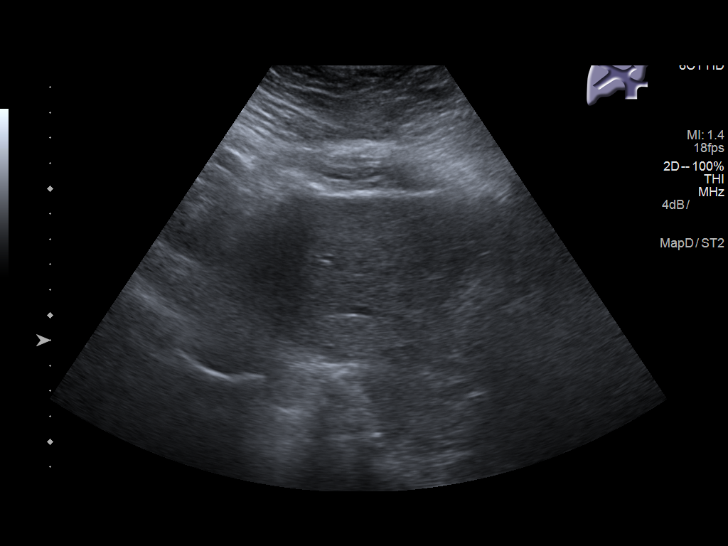
[im 44/71]
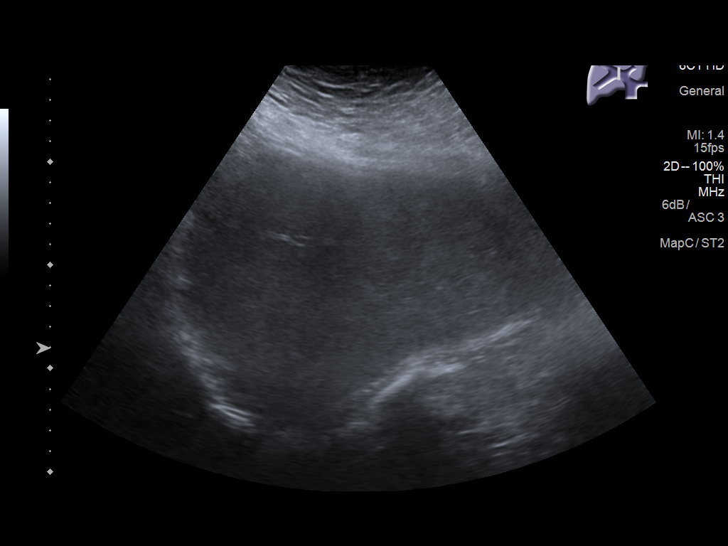
[im 47/71]
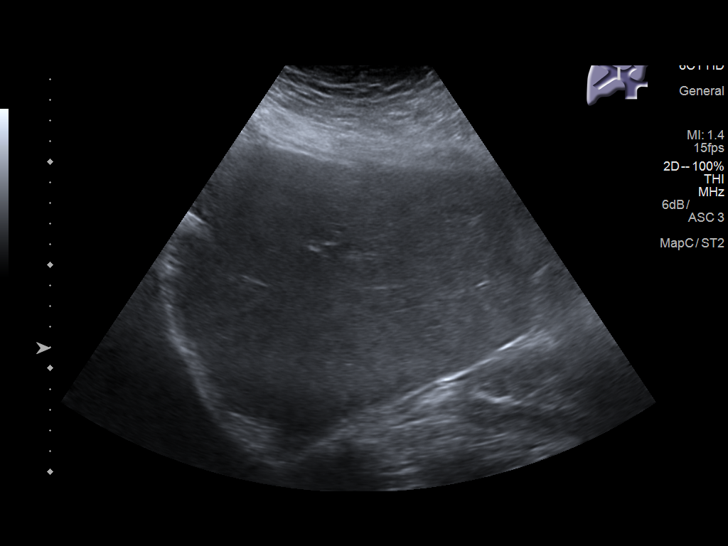
[im 53/71]
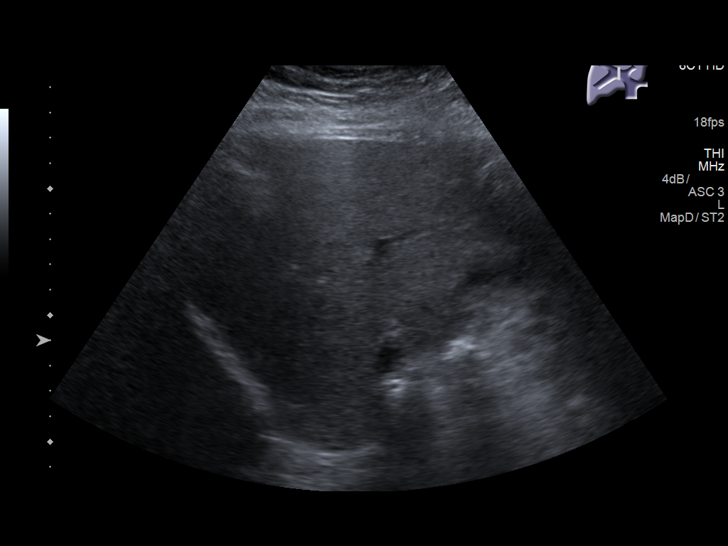
[im 59/71]
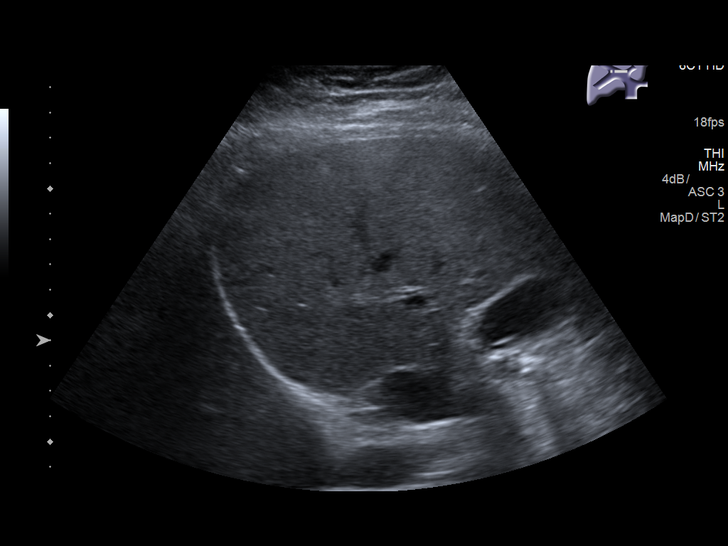
[im 65/71]
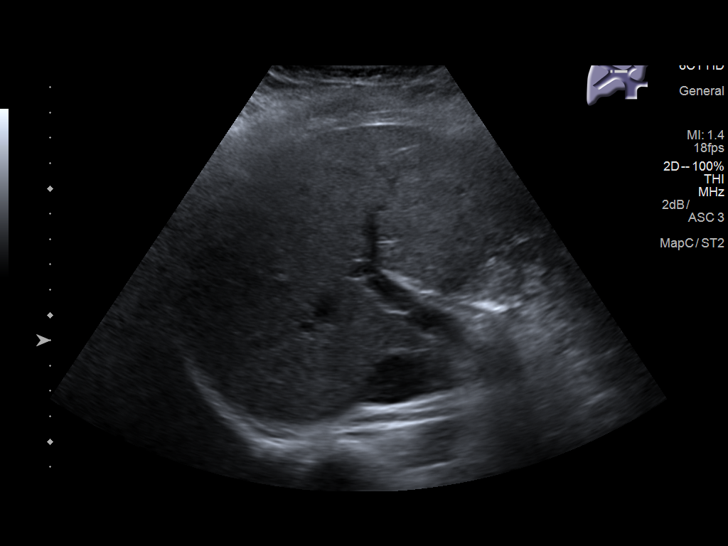
[im 71/71]
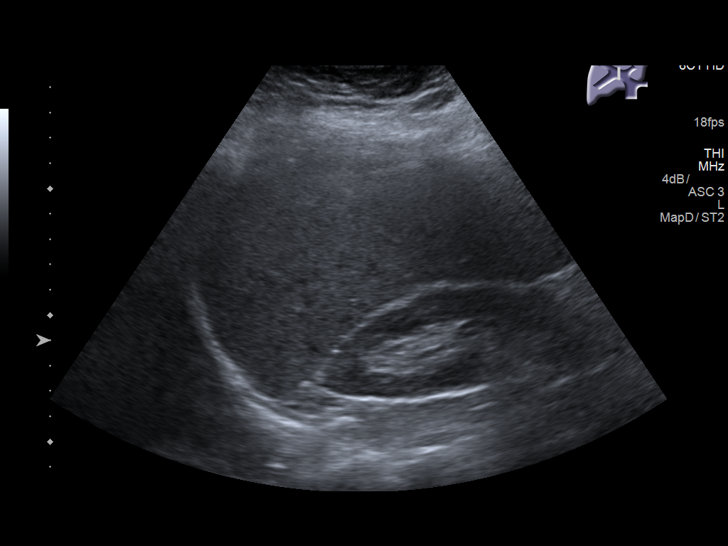

[14 of 25 positions shown; findings below may reference images not displayed]

FINDINGS: Gallbladder:

No gallstones or wall thickening visualized. No sonographic Murphy
sign noted by sonographer.

Common bile duct:

Diameter: 3.0 mm

Liver:

Slight increase echogenicity consistent fatty infiltration and/or
hepatocellular disease. Portal vein is patent on color Doppler
imaging with normal direction of blood flow towards the liver.
IMPRESSION: Slight increase echogenicity consistent with fatty infiltration
and/or hepatocellular disease. No acute or focal abnormality
otherwise noted.
